# Patient Record
Sex: Male | Born: 1965 | Race: White | Hispanic: No | Marital: Married | Smoking: Never smoker
Health system: Southern US, Community
[De-identification: ages and names within clinical notes are randomized; demographics above are authoritative.]

---

## 2002-04-04 ENCOUNTER — Encounter: Admission: RE | Admit: 2002-04-04 | Discharge: 2002-04-04 | Payer: Self-pay | Admitting: Occupational Medicine

## 2002-04-04 ENCOUNTER — Encounter: Payer: Self-pay | Admitting: Occupational Medicine

## 2009-05-22 ENCOUNTER — Emergency Department: Payer: Self-pay | Admitting: Emergency Medicine

## 2018-11-26 ENCOUNTER — Other Ambulatory Visit: Payer: Self-pay | Admitting: Otolaryngology

## 2018-11-26 ENCOUNTER — Other Ambulatory Visit (HOSPITAL_COMMUNITY): Payer: Self-pay | Admitting: Otolaryngology

## 2018-11-26 DIAGNOSIS — H9202 Otalgia, left ear: Secondary | ICD-10-CM

## 2018-12-03 ENCOUNTER — Ambulatory Visit
Admission: RE | Admit: 2018-12-03 | Discharge: 2018-12-03 | Disposition: A | Discharge status: Home / Self Care | Payer: BC Managed Care – PPO | Source: Ambulatory Visit | Attending: Otolaryngology | Admitting: Otolaryngology

## 2018-12-03 ENCOUNTER — Other Ambulatory Visit: Payer: Self-pay

## 2018-12-03 DIAGNOSIS — H9202 Otalgia, left ear: Secondary | ICD-10-CM | POA: Diagnosis present

## 2018-12-20 ENCOUNTER — Ambulatory Visit (INDEPENDENT_AMBULATORY_CARE_PROVIDER_SITE_OTHER): Payer: BC Managed Care – PPO

## 2018-12-20 ENCOUNTER — Ambulatory Visit
Admission: EM | Admit: 2018-12-20 | Discharge: 2018-12-20 | Disposition: A | Discharge status: Home / Self Care | Payer: BC Managed Care – PPO | Attending: Family Medicine | Admitting: Family Medicine

## 2018-12-20 ENCOUNTER — Encounter: Payer: Self-pay | Admitting: Emergency Medicine

## 2018-12-20 ENCOUNTER — Other Ambulatory Visit: Payer: Self-pay

## 2018-12-20 DIAGNOSIS — X500XXA Overexertion from strenuous movement or load, initial encounter: Secondary | ICD-10-CM

## 2018-12-20 DIAGNOSIS — M25531 Pain in right wrist: Secondary | ICD-10-CM

## 2018-12-20 MED ORDER — MELOXICAM 15 MG PO TABS: 15.0000 mg | ORAL_TABLET | Freq: Every day | ORAL | 0 refills | Status: AC | PRN

## 2018-12-20 NOTE — ED Triage Notes (Signed)
Patient states he injured his right hand/wrist 2 weeks ago while using a power wrench. He states it turned his hand almost completely all the way around. He has tried using a brace for 1 week and this is not helping his pain.

## 2018-12-20 NOTE — Discharge Instructions (Signed)
Rest.   Brace as needed.  Medication as prescribed.  Take care  Dr. Lacinda Axon

## 2018-12-20 NOTE — ED Provider Notes (Signed)
[facility]    CSN: [id number] Arrival date & time: 12/20/18  1745  History   Chief Complaint Chief Complaint  Patient presents with  . Hand Pain  . Hand Injury   HPI  53 year old male presents with right wrist pain.  Patient reports that approximately 2 weeks ago he injured his right wrist.  He states that he was using an impact wrench and his hand and wrist got inadvertently turned/twisted all the way around.  Patient reports that he has not taken any medication for this.  He has been using a brace without resolution.  He rates his pain as 8/10 in severity.  Worse with flexion and extension of the wrist.  He describes the pain as sharp particularly with range of motion of the wrist.  No apparent swelling.  No reports of bruising.  No other associated symptoms.  History reviewed and updated as below.  PMH: Hx of cerumen impaction, Otalgia  Past Surgical History:  Procedure Laterality Date  . APPENDECTOMY    . ROTATOR CUFF REPAIR    . wrist surgery Right     Home Medications    Prior to Admission medications   Medication Sig Start Date End Date Taking? Authorizing Provider  meloxicam (MOBIC) 15 MG tablet Take 1 tablet (15 mg total) by mouth daily as needed for pain. 12/20/18   Coral Spikes, DO   Social History Social History   Tobacco Use  . Smoking status: Never Smoker  . Smokeless tobacco: Never Used  Substance Use Topics  . Alcohol use: Yes  . Drug use: Never     Allergies   Patient has no known allergies.   Review of Systems Review of Systems  Constitutional: Negative.   Musculoskeletal:       Right wrist pain.   Physical Exam Triage Vital Signs ED Triage Vitals [12/20/18 1756]  Enc Vitals Group     BP (!) 174/76     Pulse Rate 79     Resp 18     Temp 98.6 F (37 C)     Temp Source Oral     SpO2 100 %     Weight 185 lb (83.9 kg)     Height 5\' 11"  (1.803 m)     Head Circumference      Peak Flow      Pain Score 8     Pain Loc       Pain Edu?      Excl. in [place]?    Updated Vital Signs BP (!) 174/76 (BP Location: Right Arm)   Pulse 79   Temp 98.6 F (37 C) (Oral)   Resp 18   Ht 5\' 11"  (1.803 m)   Wt 83.9 kg   SpO2 100%   BMI 25.80 kg/m   Visual Acuity Right Eye Distance:   Left Eye Distance:   Bilateral Distance:    Right Eye Near:   Left Eye Near:    Bilateral Near:     Physical Exam Vitals signs and nursing note reviewed.  Constitutional:      General: He is not in acute distress.    Appearance: Normal appearance. He is not ill-appearing.  HENT:     Head: Normocephalic and atraumatic.  Eyes:     General:        Right eye: No discharge.        Left eye: No discharge.     Conjunctiva/sclera: Conjunctivae normal.  Pulmonary:     Effort:  Pulmonary effort is normal. No respiratory distress.  Musculoskeletal:     Comments: Right hand and wrist -no discrete areas of tenderness of the right hand.  Patient endorsing tenderness over the middle of the wrist on the dorsal aspect with range of motion.  No discrete areas of tenderness to palpation.  Decreased range of motion in flexion and extension due to pain.  Skin:    General: Skin is warm.     Findings: No bruising or rash.  Neurological:     Mental Status: He is alert.  Psychiatric:        Mood and Affect: Mood normal.        Behavior: Behavior normal.    UC Treatments / Results  Labs (all labs ordered are listed, but only abnormal results are displayed) Labs Reviewed - No data to display  EKG   Radiology Dg Wrist Complete Right  Result Date: 12/20/2018 CLINICAL DATA:  Injury 2 weeks ago. EXAM: RIGHT WRIST - COMPLETE 3+ VIEW COMPARISON:  None FINDINGS: Well corticated ossific density along the dorsum of the wrist is identified and is felt to likely represent a benign abnormality. No acute fractures or subluxations identified. Soft tissues are unremarkable. IMPRESSION: 1. No acute findings. 2. No change in appearance of benign ossific  density along the dorsum of the wrist. Electronically Signed   By: Signa Kell M.D.   On: 12/20/2018 18:17    Procedures Procedures (including critical care time)  Medications Ordered in UC Medications - No data to display  Initial Impression / Assessment and Plan / UC Course  I have reviewed the triage vital signs and the nursing notes.  Pertinent labs & imaging results that were available during my care of the patient were reviewed by me and considered in my medical decision making (see chart for details).    53 year old male presents with right wrist pain/right wrist injury.  X-ray negative.  Advised rest and use of bracing.  Meloxicam as directed.  If fails to improve or worsens, needs to see orthopedics to discuss possible MRI.  Final Clinical Impressions(s) / UC Diagnoses   Final diagnoses:  Right wrist pain     Discharge Instructions     Rest.   Brace as needed.  Medication as prescribed.  Take care  Dr. Adriana Simas     ED Prescriptions    Medication Sig Dispense Auth. Provider   meloxicam (MOBIC) 15 MG tablet Take 1 tablet (15 mg total) by mouth daily as needed for pain. 30 tablet Tommie Sams, DO     PDMP not reviewed this encounter.   Tommie Sams, [place] 12/20/18 1829

## 2019-12-02 ENCOUNTER — Other Ambulatory Visit: Payer: Self-pay

## 2019-12-02 ENCOUNTER — Encounter: Payer: Self-pay | Admitting: Emergency Medicine

## 2019-12-02 ENCOUNTER — Emergency Department: Payer: BC Managed Care – PPO

## 2019-12-02 ENCOUNTER — Emergency Department
Admission: EM | Admit: 2019-12-02 | Discharge: 2019-12-02 | Disposition: A | Discharge status: Home / Self Care | Payer: BC Managed Care – PPO | Attending: Emergency Medicine | Admitting: Emergency Medicine

## 2019-12-02 DIAGNOSIS — U071 COVID-19: Secondary | ICD-10-CM | POA: Diagnosis not present

## 2019-12-02 DIAGNOSIS — R0602 Shortness of breath: Secondary | ICD-10-CM | POA: Diagnosis present

## 2019-12-02 DIAGNOSIS — J1282 Pneumonia due to coronavirus disease 2019: Secondary | ICD-10-CM | POA: Diagnosis not present

## 2019-12-02 LAB — CBC WITH DIFFERENTIAL/PLATELET
Abs Immature Granulocytes: 0.1 10*3/uL — ABNORMAL HIGH (ref 0.00–0.07)
Basophils Absolute: 0 10*3/uL (ref 0.0–0.1)
Basophils Relative: 1 %
Eosinophils Absolute: 0 10*3/uL (ref 0.0–0.5)
Eosinophils Relative: 1 %
HCT: 42.7 % (ref 39.0–52.0)
Hemoglobin: 14.5 g/dL (ref 13.0–17.0)
Immature Granulocytes: 1 %
Lymphocytes Relative: 45 %
Lymphs Abs: 4 10*3/uL (ref 0.7–4.0)
MCH: 30.1 pg (ref 26.0–34.0)
MCHC: 34 g/dL (ref 30.0–36.0)
MCV: 88.8 fL (ref 80.0–100.0)
Monocytes Absolute: 0.4 10*3/uL (ref 0.1–1.0)
Monocytes Relative: 5 %
Neutro Abs: 4.2 10*3/uL (ref 1.7–7.7)
Neutrophils Relative %: 47 %
Platelets: 364 10*3/uL (ref 150–400)
RBC: 4.81 MIL/uL (ref 4.22–5.81)
RDW: 13.6 % (ref 11.5–15.5)
Smear Review: NORMAL
WBC: 8.7 10*3/uL (ref 4.0–10.5)
nRBC: 0 % (ref 0.0–0.2)

## 2019-12-02 LAB — COMPREHENSIVE METABOLIC PANEL
ALT: 50 U/L — ABNORMAL HIGH (ref 0–44)
AST: 56 U/L — ABNORMAL HIGH (ref 15–41)
Albumin: 2.8 g/dL — ABNORMAL LOW (ref 3.5–5.0)
Alkaline Phosphatase: 115 U/L (ref 38–126)
Anion gap: 9 (ref 5–15)
BUN: 10 mg/dL (ref 6–20)
CO2: 27 mmol/L (ref 22–32)
Calcium: 8.3 mg/dL — ABNORMAL LOW (ref 8.9–10.3)
Chloride: 97 mmol/L — ABNORMAL LOW (ref 98–111)
Creatinine, Ser: 0.97 mg/dL (ref 0.61–1.24)
GFR calc Af Amer: >60 mL/min (ref >60)
GFR calc non Af Amer: >60 mL/min (ref >60)
Glucose, Bld: 106 mg/dL — ABNORMAL HIGH (ref 70–99)
Potassium: 3.7 mmol/L (ref 3.5–5.1)
Sodium: 133 mmol/L — ABNORMAL LOW (ref 135–145)
Total Bilirubin: 0.5 mg/dL (ref 0.3–1.2)
Total Protein: 6.8 g/dL (ref 6.5–8.1)

## 2019-12-02 MED ORDER — PREDNISONE 50 MG PO TABS: 50.0000 mg | ORAL_TABLET | Freq: Every day | ORAL | 0 refills | Status: AC

## 2019-12-02 NOTE — ED Triage Notes (Signed)
Pt to ED via POV stating that he tested positive for COVID on 9/28 and does not seem to be getting any better. Pt states that he still feels weak and is having episodes of shortness of breath and dizziness. Pt is in NAD. VSS. Pt is talking in complete sentences at this time.

## 2019-12-02 NOTE — ED Provider Notes (Signed)
[facility] Emergency Department Provider Note   ____________________________________________    I have reviewed the triage vital signs and the nursing notes.   HISTORY  Chief Complaint Shortness of Breath and Dizziness     HPI Chad Stevenson is a 54 y.o. male who was diagnosed with COVID-19 on 28 September who presents with complaints of intermittent shortness of breath with exertion, fatigue, occasional dizziness.  Feels better when lying down.  His wife also has COVID-19.  He is unvaccinated.  Has not received monoclonal antibodies.  Does not take any medications.  Occasional chills, some myalgias.  History reviewed. No pertinent past medical history.  There are no problems to display for this patient.   Past Surgical History:  Procedure Laterality Date  . APPENDECTOMY    . ROTATOR CUFF REPAIR    . wrist surgery Right     Prior to Admission medications   Medication Sig Start Date End Date Taking? Authorizing Provider  meloxicam (MOBIC) 15 MG tablet Take 1 tablet (15 mg total) by mouth daily as needed for pain. 12/20/18   Tommie Sams, DO  predniSONE (DELTASONE) 50 MG tablet Take 1 tablet (50 mg total) by mouth daily with breakfast. 12/02/19   Jene Every, MD     Allergies Patient has no known allergies.  No family history on file.  Social History Social History   Tobacco Use  . Smoking status: Never Smoker  . Smokeless tobacco: Never Used  Substance Use Topics  . Alcohol use: Yes  . Drug use: Never    Review of Systems  Constitutional: Chills as above Eyes: No visual changes.  ENT: No sore throat. Cardiovascular: Denies chest pain. Respiratory: As above Gastrointestinal: No abdominal pain.  No nausea, no vomiting.   Genitourinary: Negative for dysuria. Musculoskeletal: Negative for back pain. Skin: Negative for rash. Neurological: Negative for headaches or  weakness   ____________________________________________   PHYSICAL EXAM:  VITAL SIGNS: ED Triage Vitals  Enc Vitals Group     BP 12/02/19 0800 106/60     Pulse Rate 12/02/19 0800 93     Resp 12/02/19 0800 16     Temp 12/02/19 0802 98.3 F (36.8 C)     Temp Source 12/02/19 0802 Oral     SpO2 12/02/19 0800 96 %     Weight 12/02/19 0800 81.6 kg (180 lb)     Height 12/02/19 0800 1.778 m (5\' 10" )     Head Circumference --      Peak Flow --      Pain Score 12/02/19 0800 0     Pain Loc --      Pain Edu? --      Excl. in [place]? --     Constitutional: Alert and oriented.   Nose: No congestion/rhinnorhea. Mouth/Throat: Mucous membranes are moist.   Neck:  Painless ROM Cardiovascular: Normal rate, regular rhythm. Good peripheral circulation. Respiratory: Normal respiratory effort.  No retractions.   Genitourinary: deferred Musculoskeletal: Warm and well perfused Neurologic:  Normal speech and language. No gross focal neurologic deficits are appreciated.  Skin:  Skin is warm, dry and intact. No rash noted. Psychiatric: Mood and affect are normal. Speech and behavior are normal.  ____________________________________________   LABS (all labs ordered are listed, but only abnormal results are displayed)  Labs Reviewed  CBC WITH DIFFERENTIAL/PLATELET - Abnormal; Notable for the following components:      Result Value   Abs Immature Granulocytes 0.10 (*)    All  other components within normal limits  COMPREHENSIVE METABOLIC PANEL - Abnormal; Notable for the following components:   Sodium 133 (*)    Chloride 97 (*)    Glucose, Bld 106 (*)    Calcium 8.3 (*)    Albumin 2.8 (*)    AST 56 (*)    ALT 50 (*)    All other components within normal limits   ____________________________________________  EKG  ED ECG REPORT I, Jene Every, the attending physician, personally viewed and interpreted this ECG.  Date: 12/02/2019  Rhythm: normal sinus rhythm QRS Axis:  normal Intervals: normal ST/T Wave abnormalities: normal Narrative Interpretation: no evidence of acute ischemia  ____________________________________________  RADIOLOGY  Chest x-ray reviewed by me, bilateral infiltrates consistent with COVID-19 ____________________________________________   PROCEDURES  Procedure(s) performed: No  Procedures   Critical Care performed: No ____________________________________________   INITIAL IMPRESSION / ASSESSMENT AND PLAN / ED COURSE  Pertinent labs & imaging results that were available during my care of the patient were reviewed by me and considered in my medical decision making (see chart for details).  Patient with known COVID-19, presents with cough, severe fatigue, generalized weakness.  Vital signs are overall reassuring, oxygenation is 90%, normal blood pressure, normal heart rate.  Chest x-ray is consistent with COVID-19 pneumonia.  Lab work is overall reassuring  I have referred the patient to the monoclonal antibody infusion center given that he is still within 10 days, unclear if he may meet criteria    ____________________________________________   FINAL CLINICAL IMPRESSION(S) / ED DIAGNOSES  Final diagnoses:  Pneumonia due to COVID-19 virus        Note:  This document was prepared using Dragon voice recognition software and may include unintentional dictation errors.   Jene Every, MD 12/02/19 1538

## 2019-12-02 NOTE — ED Notes (Signed)
Sent rainbow to lab. 

## 2022-02-10 IMAGING — CR DG CHEST 2V
1 series · 2 of 2 positions shown · non-contrast
Comparison: None.

CLINICAL DATA: Shortness of breath.

EXAM:
CHEST - 2 VIEW

[Series 1: w chest pa · 0.14mm/px · 2 of 2 slices shown]
[im 1/2]
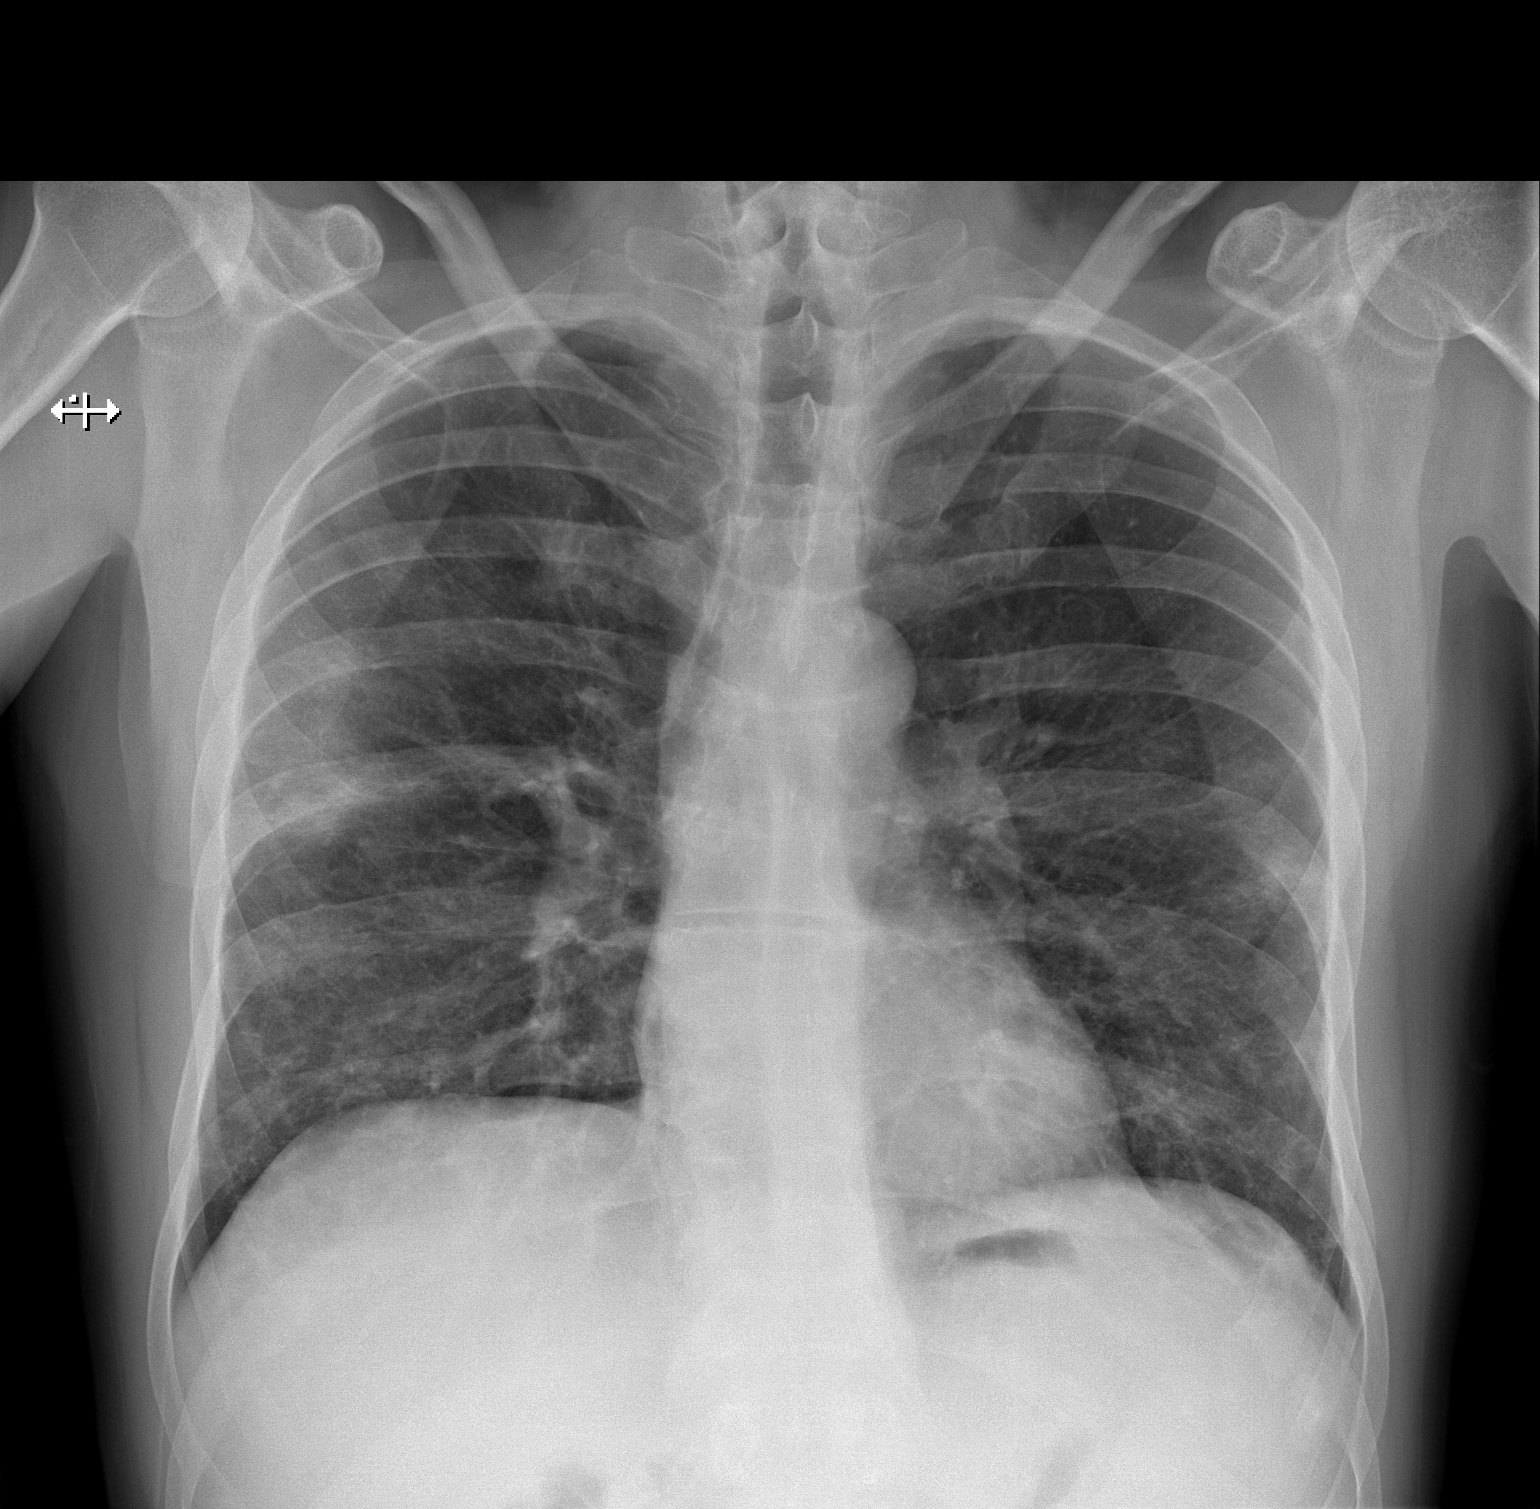
[im 2/2]
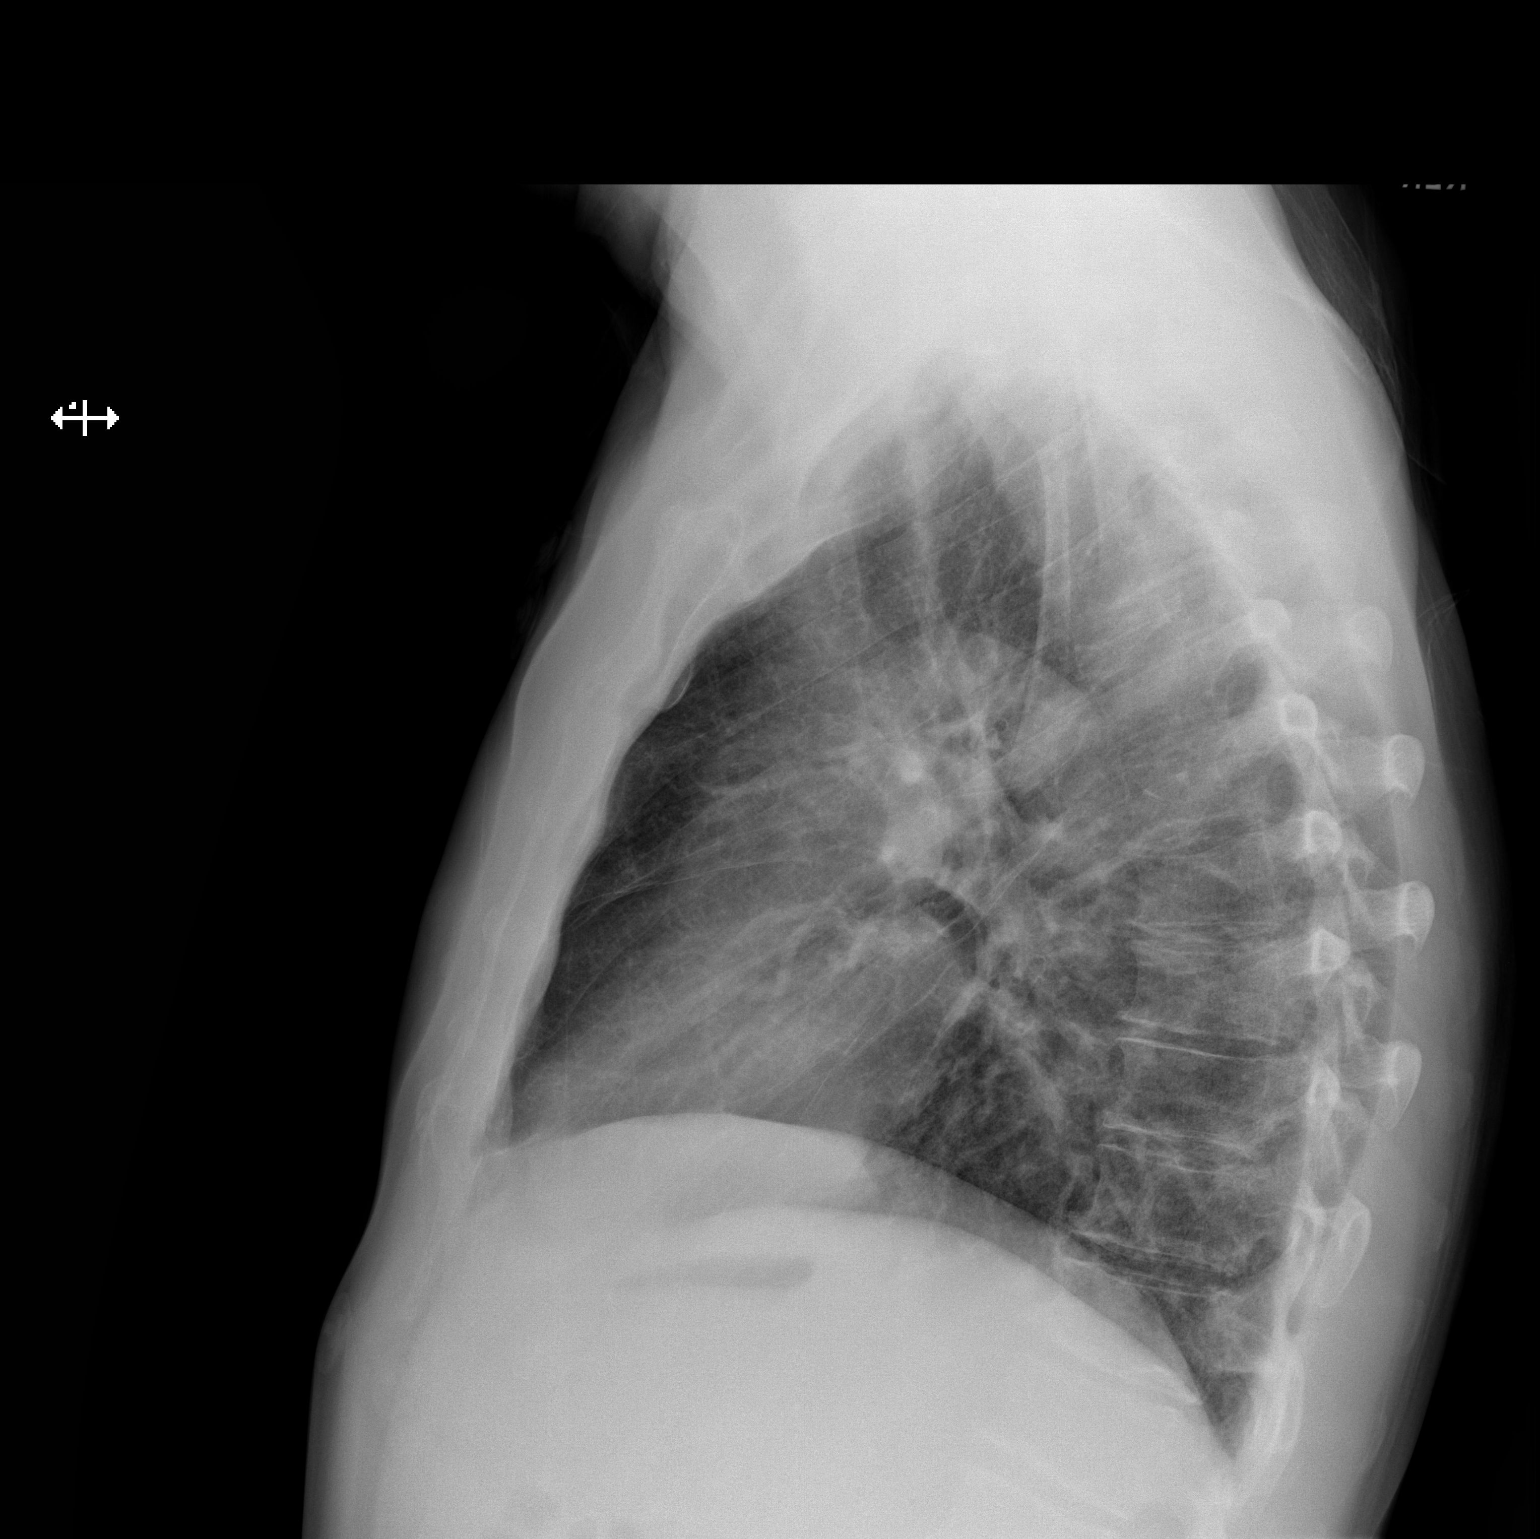

[2 of 2 positions shown; findings below may reference images not displayed]

FINDINGS: The heart size and mediastinal contours are within normal limits. No
pneumothorax or pleural effusion is noted. Patchy airspace opacities
are noted bilaterally most consistent with multifocal pneumonia due
to 7CJOM-64. The visualized skeletal structures are unremarkable.
IMPRESSION: Patchy bilateral airspace opacities are noted most consistent with
multifocal pneumonia due to 7CJOM-64.
